# Patient Record
Sex: Male | Born: 1955 | Race: White | Hispanic: No | Marital: Married | Smoking: Never smoker
Health system: Southern US, Community
[De-identification: ages and names within clinical notes are randomized; demographics above are authoritative.]

## PROBLEM LIST (undated history)

## (undated) DIAGNOSIS — Z9289 Personal history of other medical treatment: Secondary | ICD-10-CM

## (undated) DIAGNOSIS — N200 Calculus of kidney: Secondary | ICD-10-CM

## (undated) DIAGNOSIS — J189 Pneumonia, unspecified organism: Secondary | ICD-10-CM

## (undated) DIAGNOSIS — Z9889 Other specified postprocedural states: Secondary | ICD-10-CM

## (undated) DIAGNOSIS — L02419 Cutaneous abscess of limb, unspecified: Secondary | ICD-10-CM

## (undated) DIAGNOSIS — L03119 Cellulitis of unspecified part of limb: Secondary | ICD-10-CM

## (undated) DIAGNOSIS — I4891 Unspecified atrial fibrillation: Secondary | ICD-10-CM

## (undated) HISTORY — PX: CHOLECYSTECTOMY: SHX55

## (undated) HISTORY — PX: HERNIA REPAIR: SHX51

## (undated) HISTORY — PX: APPENDECTOMY: SHX54

---

## 2005-10-27 HISTORY — PX: CARDIAC SURGERY: SHX584

## 2005-10-27 HISTORY — PX: CARDIAC CATHETERIZATION: SHX172

## 2006-10-27 DIAGNOSIS — Z9889 Other specified postprocedural states: Secondary | ICD-10-CM

## 2006-10-27 HISTORY — DX: Other specified postprocedural states: Z98.890

## 2014-07-29 ENCOUNTER — Emergency Department (HOSPITAL_COMMUNITY)
Admission: EM | Admit: 2014-07-29 | Discharge: 2014-07-29 | Disposition: A | Discharge status: Home / Self Care | Payer: PRIVATE HEALTH INSURANCE | Attending: Emergency Medicine | Admitting: Emergency Medicine

## 2014-07-29 ENCOUNTER — Encounter (HOSPITAL_COMMUNITY): Payer: Self-pay | Admitting: Emergency Medicine

## 2014-07-29 ENCOUNTER — Emergency Department (HOSPITAL_COMMUNITY): Payer: PRIVATE HEALTH INSURANCE

## 2014-07-29 DIAGNOSIS — Z7952 Long term (current) use of systemic steroids: Secondary | ICD-10-CM | POA: Diagnosis not present

## 2014-07-29 DIAGNOSIS — S52601A Unspecified fracture of lower end of right ulna, initial encounter for closed fracture: Secondary | ICD-10-CM | POA: Diagnosis not present

## 2014-07-29 DIAGNOSIS — Z79899 Other long term (current) drug therapy: Secondary | ICD-10-CM | POA: Diagnosis not present

## 2014-07-29 DIAGNOSIS — Z87442 Personal history of urinary calculi: Secondary | ICD-10-CM | POA: Diagnosis not present

## 2014-07-29 DIAGNOSIS — S01422A Laceration with foreign body of left cheek and temporomandibular area, initial encounter: Secondary | ICD-10-CM | POA: Insufficient documentation

## 2014-07-29 DIAGNOSIS — Z872 Personal history of diseases of the skin and subcutaneous tissue: Secondary | ICD-10-CM | POA: Diagnosis not present

## 2014-07-29 DIAGNOSIS — Z8701 Personal history of pneumonia (recurrent): Secondary | ICD-10-CM | POA: Diagnosis not present

## 2014-07-29 DIAGNOSIS — S0181XA Laceration without foreign body of other part of head, initial encounter: Secondary | ICD-10-CM

## 2014-07-29 DIAGNOSIS — S0990XA Unspecified injury of head, initial encounter: Secondary | ICD-10-CM | POA: Diagnosis present

## 2014-07-29 DIAGNOSIS — Y9389 Activity, other specified: Secondary | ICD-10-CM | POA: Insufficient documentation

## 2014-07-29 DIAGNOSIS — Y929 Unspecified place or not applicable: Secondary | ICD-10-CM | POA: Diagnosis not present

## 2014-07-29 DIAGNOSIS — Z792 Long term (current) use of antibiotics: Secondary | ICD-10-CM | POA: Insufficient documentation

## 2014-07-29 DIAGNOSIS — Z8739 Personal history of other diseases of the musculoskeletal system and connective tissue: Secondary | ICD-10-CM | POA: Diagnosis not present

## 2014-07-29 DIAGNOSIS — Z9889 Other specified postprocedural states: Secondary | ICD-10-CM | POA: Diagnosis not present

## 2014-07-29 DIAGNOSIS — S52501A Unspecified fracture of the lower end of right radius, initial encounter for closed fracture: Secondary | ICD-10-CM | POA: Diagnosis not present

## 2014-07-29 DIAGNOSIS — Z7982 Long term (current) use of aspirin: Secondary | ICD-10-CM | POA: Insufficient documentation

## 2014-07-29 DIAGNOSIS — W19XXXA Unspecified fall, initial encounter: Secondary | ICD-10-CM

## 2014-07-29 DIAGNOSIS — W01198A Fall on same level from slipping, tripping and stumbling with subsequent striking against other object, initial encounter: Secondary | ICD-10-CM | POA: Diagnosis not present

## 2014-07-29 HISTORY — DX: Cutaneous abscess of limb, unspecified: L02.419

## 2014-07-29 HISTORY — DX: Other specified postprocedural states: Z98.890

## 2014-07-29 HISTORY — DX: Calculus of kidney: N20.0

## 2014-07-29 HISTORY — DX: Cutaneous abscess of limb, unspecified: L03.119

## 2014-07-29 HISTORY — DX: Personal history of other medical treatment: Z92.89

## 2014-07-29 HISTORY — DX: Unspecified atrial fibrillation: I48.91

## 2014-07-29 HISTORY — DX: Pneumonia, unspecified organism: J18.9

## 2014-07-29 LAB — CBC WITH DIFFERENTIAL/PLATELET
BASOS ABS: 0.1 10*3/uL (ref 0.0–0.1)
Basophils Relative: 1 % (ref 0–1)
EOS PCT: 2 % (ref 0–5)
Eosinophils Absolute: 0.2 10*3/uL (ref 0.0–0.7)
HCT: 40.9 % (ref 39.0–52.0)
Hemoglobin: 14.1 g/dL (ref 13.0–17.0)
Lymphocytes Relative: 12 % (ref 12–46)
Lymphs Abs: 1 10*3/uL (ref 0.7–4.0)
MCH: 34.4 pg — ABNORMAL HIGH (ref 26.0–34.0)
MCHC: 34.5 g/dL (ref 30.0–36.0)
MCV: 99.8 fL (ref 78.0–100.0)
Monocytes Absolute: 0.9 10*3/uL (ref 0.1–1.0)
Monocytes Relative: 10 % (ref 3–12)
NEUTROS PCT: 75 % (ref 43–77)
Neutro Abs: 6.1 10*3/uL (ref 1.7–7.7)
PLATELETS: 119 10*3/uL — AB (ref 150–400)
RBC: 4.1 MIL/uL — ABNORMAL LOW (ref 4.22–5.81)
RDW: 14.6 % (ref 11.5–15.5)
WBC: 8.2 10*3/uL (ref 4.0–10.5)

## 2014-07-29 LAB — BASIC METABOLIC PANEL
ANION GAP: 12 (ref 5–15)
BUN: 21 mg/dL (ref 6–23)
CALCIUM: 8.7 mg/dL (ref 8.4–10.5)
CO2: 22 mEq/L (ref 19–32)
Chloride: 102 mEq/L (ref 96–112)
Creatinine, Ser: 1.45 mg/dL — ABNORMAL HIGH (ref 0.50–1.35)
GFR calc non Af Amer: 52 mL/min — ABNORMAL LOW (ref >90)
GFR, EST AFRICAN AMERICAN: 61 mL/min — AB (ref >90)
Glucose, Bld: 114 mg/dL — ABNORMAL HIGH (ref 70–99)
POTASSIUM: 4.2 meq/L (ref 3.7–5.3)
Sodium: 136 mEq/L — ABNORMAL LOW (ref 137–147)

## 2014-07-29 MED ORDER — CEFAZOLIN SODIUM 1-5 GM-% IV SOLN
1.0000 g | Freq: Once | INTRAVENOUS | Status: AC
  Administered 2014-07-29: 1 g via INTRAVENOUS
  Filled 2014-07-29: qty 50

## 2014-07-29 MED ORDER — FENTANYL CITRATE 0.05 MG/ML IJ SOLN
50.0000 ug | Freq: Once | INTRAMUSCULAR | Status: AC
  Administered 2014-07-29: 50 ug via INTRAVENOUS
  Filled 2014-07-29: qty 2

## 2014-07-29 MED ORDER — MORPHINE SULFATE 4 MG/ML IJ SOLN
4.0000 mg | Freq: Once | INTRAMUSCULAR | Status: AC
  Administered 2014-07-29: 4 mg via INTRAVENOUS
  Filled 2014-07-29: qty 1

## 2014-07-29 MED ORDER — OXYCODONE-ACETAMINOPHEN 5-325 MG PO TABS: 1.0000 | ORAL_TABLET | ORAL | Status: DC | PRN

## 2014-07-29 MED ORDER — CEPHALEXIN 500 MG PO CAPS: 500.0000 mg | ORAL_CAPSULE | Freq: Four times a day (QID) | ORAL | Status: AC

## 2014-07-29 NOTE — ED Provider Notes (Signed)
I saw and evaluated the patient, reviewed the resident's note and I agree with the findings and plan.   EKG Interpretation None      Russell Vasquez is a 58 y.o. male hx of afib on pradaxa here with fall. Tripped and fell and hit his face against metal sliding door. Tetanus up to date. On exam, large laceration from corner of R eye to the scalp. Not involving the galea Able to raise R eyebrow. Also has bruise on R forearm and tenderness on the arm. Given ancef. CT head/neck/face unremarkable. Called ENT given large and complex laceration. R forearm xray showed impacted comminuted fracture R distal radius. Ortho, Dr. Janee Mornhompson, called, who recommend sugar tongue splint and outpatient f/u. Will d/c home with keflex, pain meds.    Richardean Canalavid H Yao, MD 07/29/14 [phone]

## 2014-07-29 NOTE — ED Notes (Signed)
Laceration documented on physical diagram

## 2014-07-29 NOTE — Consult Note (Signed)
Reason for Consult: Complex facial and scalp lacerations Referring Physician: Renne Musca, MD  HPI:  Russell Vasquez is an 58 y.o. male who was transported to the [facility] ER for treatment of his large facial and head lacerations. He sustained the injury from an accidental fall, cutting his head on a metal door. He has a history of afib, and is currently on Pradexa. Patient also landed on an outstretched right arm and has deformity to the mid forearm. The patient denies any neck pain, denies chest or abdominal pain. Patient is neurovascularly intact. Patient states that he had a tetanus shot one month ago.  Past Medical History  Diagnosis Date  . A-fib   . Pneumonia   . Cellulitis and abscess of lower extremity     left  . H/O colonoscopy 2008    with polyps  . History of cardioversion 2008, 2007, 2006 x2, 2005, 2004    for afib  . Kidney stones     Past Surgical History  Procedure Laterality Date  . Cholecystectomy    . Cardiac surgery  2007  . Cardiac catheterization  2007  . Hernia repair    . Appendectomy      No family history on file.  Social History:  reports that he has never smoked. He does not have any smokeless tobacco history on file. He reports that he does not drink alcohol or use illicit drugs.  Allergies: No Known Allergies  Prior to Admission medications   Medication Sig Start Date End Date Taking? Authorizing Provider  acetaminophen (TYLENOL) 325 MG tablet Take 650 mg by mouth every 6 (six) hours as needed for moderate pain.   Yes Historical Provider, MD  aspirin EC 81 MG tablet Take 81 mg by mouth 2 (two) times daily.   Yes Historical Provider, MD  carvedilol (COREG) 25 MG tablet Take 50 mg by mouth 2 (two) times daily with a meal.   Yes Historical Provider, MD  citalopram (CELEXA) 20 MG tablet Take 20 mg by mouth daily.   Yes Historical Provider, MD  dabigatran (PRADAXA) 150 MG CAPS capsule Take 150 mg by mouth 2 (two) times daily.   Yes Historical Provider, MD    lisinopril (PRINIVIL,ZESTRIL) 40 MG tablet Take 40 mg by mouth daily.   Yes Historical Provider, MD  Miconazole Nitrate (ZEASORB-AF EX) Apply 1 application topically daily.   Yes Historical Provider, MD  Omega-3 Fatty Acids (FISH OIL) 1000 MG CAPS Take 1,000 mg by mouth 2 (two) times daily.   Yes Historical Provider, MD  rosuvastatin (CRESTOR) 20 MG tablet Take 20 mg by mouth at bedtime.   Yes Historical Provider, MD  testosterone (ANDROGEL) 50 MG/5GM (1%) GEL Place 5 g onto the skin daily.   Yes Historical Provider, MD  triamterene-hydrochlorothiazide (MAXZIDE-25) 37.5-25 MG per tablet Take 1 tablet by mouth daily.   Yes Historical Provider, MD  zolpidem (AMBIEN) 10 MG tablet Take 10 mg by mouth at bedtime.   Yes Historical Provider, MD  cephALEXin (KEFLEX) 500 MG capsule Take 1 capsule (500 mg total) by mouth 4 (four) times daily. 07/29/14 08/08/14  Renne Musca, MD  furosemide (LASIX) 40 MG tablet Take 40 mg by mouth as needed for fluid or edema.    Historical Provider, MD  oxyCODONE-acetaminophen (PERCOCET/ROXICET) 5-325 MG per tablet Take 1 tablet by mouth every 4 (four) hours as needed for severe pain. 07/29/14   Renne Musca, MD    Results for orders placed during the hospital encounter of 07/29/14 (from  the past 48 hour(s))  CBC WITH DIFFERENTIAL     Status: Abnormal   Collection Time    07/29/14  3:51 PM      Result Value Ref Range   WBC 8.2  4.0 - 10.5 K/uL   RBC 4.10 (*) 4.22 - 5.81 MIL/uL   Hemoglobin 14.1  13.0 - 17.0 g/dL   HCT 40.9  39.0 - 52.0 %   MCV 99.8  78.0 - 100.0 fL   MCH 34.4 (*) 26.0 - 34.0 pg   MCHC 34.5  30.0 - 36.0 g/dL   RDW 14.6  11.5 - 15.5 %   Platelets 119 (*) 150 - 400 K/uL   Comment: PLATELET COUNT CONFIRMED BY SMEAR   Neutrophils Relative % 75  43 - 77 %   Neutro Abs 6.1  1.7 - 7.7 K/uL   Lymphocytes Relative 12  12 - 46 %   Lymphs Abs 1.0  0.7 - 4.0 K/uL   Monocytes Relative 10  3 - 12 %   Monocytes Absolute 0.9  0.1 - 1.0 K/uL   Eosinophils  Relative 2  0 - 5 %   Eosinophils Absolute 0.2  0.0 - 0.7 K/uL   Basophils Relative 1  0 - 1 %   Basophils Absolute 0.1  0.0 - 0.1 K/uL  BASIC METABOLIC PANEL     Status: Abnormal   Collection Time    07/29/14  3:51 PM      Result Value Ref Range   Sodium 136 (*) 137 - 147 mEq/L   Potassium 4.2  3.7 - 5.3 mEq/L   Chloride 102  96 - 112 mEq/L   CO2 22  19 - 32 mEq/L   Glucose, Bld 114 (*) 70 - 99 mg/dL   BUN 21  6 - 23 mg/dL   Creatinine, Ser 1.45 (*) 0.50 - 1.35 mg/dL   Calcium 8.7  8.4 - 10.5 mg/dL   GFR calc non Af Amer 52 (*) >90 mL/min   GFR calc Af Amer 61 (*) >90 mL/min   Comment: (NOTE)     The eGFR has been calculated using the CKD EPI equation.     This calculation has not been validated in all clinical situations.     eGFR's persistently <90 mL/min signify possible Chronic Kidney     Disease.   Anion gap 12  5 - 15    Dg Forearm Right  07/29/2014   CLINICAL DATA:  Status post fall.  Wrist injury.  Initial encounter.  EXAM: RIGHT FOREARM - 2 VIEW  COMPARISON:  None.  FINDINGS: There is a comminuted impacted fracture of the distal radius extending to the articular surface. Additionally there is a minimally displaced fracture of the ulnar styloid process. No evidence for associated acute fractures. Soft tissues unremarkable.  IMPRESSION: Impacted comminuted fracture of the distal radius with intra-articular extension.  Ulnar styloid process fracture.   Electronically Signed   By: Lovey Newcomer M.D.   On: 07/29/2014 17:01   Dg Wrist Complete Right  07/29/2014   CLINICAL DATA:  Fall while walking today as tripped over curb and injured right wrist and distal forearm with laceration.  EXAM: RIGHT WRIST - COMPLETE 3+ VIEW  COMPARISON:  None.  FINDINGS: The examination demonstrates a minimally displaced transverse fracture of the distal radial metaphysis with minimal comminution dorsally. There is a displaced ulnar styloid fracture.  IMPRESSION: Displaced slightly comminuted transverse  fracture of the distal radial metaphysis. Displaced ulnar styloid fracture.   Electronically  Signed   By: Marin Olp M.D.   On: 07/29/2014 16:55   Ct Head Wo Contrast  07/29/2014   CLINICAL DATA:  Patient tripped over a curb and fell hitting his head on a door with right arm deformity and laceration over right temporal area  EXAM: CT HEAD WITHOUT CONTRAST  CT MAXILLOFACIAL WITHOUT CONTRAST  CT CERVICAL SPINE WITHOUT CONTRAST  TECHNIQUE: Multidetector CT imaging of the head, cervical spine, and maxillofacial structures were performed using the standard protocol without intravenous contrast. Multiplanar CT image reconstructions of the cervical spine and maxillofacial structures were also generated.  COMPARISON:  None.  FINDINGS: CT HEAD FINDINGS  Heavy calcification of the anterior falx seen incidentally. No intracranial hemorrhage or extra-axial fluid. No evidence of infarct, mass, or hydrocephalus. Extensive right scalp hematoma beginning in the right temporal region and extending to the right vertex. No skull fracture is identified.  CT MAXILLOFACIAL FINDINGS  There are no facial bone fractures. No significant sinus opacification. No air-fluid levels in the sinuses. In the right temporal region there is a large soft tissue laceration with foci of subcutaneous gas. New  CT CERVICAL SPINE FINDINGS  No soft tissue abnormalities. Carotid calcification noted bilaterally. Lung apices clear bilaterally.  No evidence of cervical spine fracture. No paraspinous hematoma. Normal anterior-posterior alignment. Multilevel degenerative disc disease. Degenerative disc disease is of moderate severity at C5-6 through C7-T1.  IMPRESSION: Soft tissue scalp laceration on the right. No acute intracranial abnormality.  No evidence of facial bone fracture.  Degenerative changes with no acute findings in the cervical spine.   Electronically Signed   By: Skipper Cliche M.D.   On: 07/29/2014 16:16   Ct Cervical Spine Wo  Contrast  07/29/2014   CLINICAL DATA:  Patient tripped over a curb and fell hitting his head on a door with right arm deformity and laceration over right temporal area  EXAM: CT HEAD WITHOUT CONTRAST  CT MAXILLOFACIAL WITHOUT CONTRAST  CT CERVICAL SPINE WITHOUT CONTRAST  TECHNIQUE: Multidetector CT imaging of the head, cervical spine, and maxillofacial structures were performed using the standard protocol without intravenous contrast. Multiplanar CT image reconstructions of the cervical spine and maxillofacial structures were also generated.  COMPARISON:  None.  FINDINGS: CT HEAD FINDINGS  Heavy calcification of the anterior falx seen incidentally. No intracranial hemorrhage or extra-axial fluid. No evidence of infarct, mass, or hydrocephalus. Extensive right scalp hematoma beginning in the right temporal region and extending to the right vertex. No skull fracture is identified.  CT MAXILLOFACIAL FINDINGS  There are no facial bone fractures. No significant sinus opacification. No air-fluid levels in the sinuses. In the right temporal region there is a large soft tissue laceration with foci of subcutaneous gas. New  CT CERVICAL SPINE FINDINGS  No soft tissue abnormalities. Carotid calcification noted bilaterally. Lung apices clear bilaterally.  No evidence of cervical spine fracture. No paraspinous hematoma. Normal anterior-posterior alignment. Multilevel degenerative disc disease. Degenerative disc disease is of moderate severity at C5-6 through C7-T1.  IMPRESSION: Soft tissue scalp laceration on the right. No acute intracranial abnormality.  No evidence of facial bone fracture.  Degenerative changes with no acute findings in the cervical spine.   Electronically Signed   By: Skipper Cliche M.D.   On: 07/29/2014 16:16   Ct Maxillofacial Wo Cm  07/29/2014   CLINICAL DATA:  Patient tripped over a curb and fell hitting his head on a door with right arm deformity and laceration over right temporal area  EXAM: CT  HEAD WITHOUT CONTRAST  CT MAXILLOFACIAL WITHOUT CONTRAST  CT CERVICAL SPINE WITHOUT CONTRAST  TECHNIQUE: Multidetector CT imaging of the head, cervical spine, and maxillofacial structures were performed using the standard protocol without intravenous contrast. Multiplanar CT image reconstructions of the cervical spine and maxillofacial structures were also generated.  COMPARISON:  None.  FINDINGS: CT HEAD FINDINGS  Heavy calcification of the anterior falx seen incidentally. No intracranial hemorrhage or extra-axial fluid. No evidence of infarct, mass, or hydrocephalus. Extensive right scalp hematoma beginning in the right temporal region and extending to the right vertex. No skull fracture is identified.  CT MAXILLOFACIAL FINDINGS  There are no facial bone fractures. No significant sinus opacification. No air-fluid levels in the sinuses. In the right temporal region there is a large soft tissue laceration with foci of subcutaneous gas. New  CT CERVICAL SPINE FINDINGS  No soft tissue abnormalities. Carotid calcification noted bilaterally. Lung apices clear bilaterally.  No evidence of cervical spine fracture. No paraspinous hematoma. Normal anterior-posterior alignment. Multilevel degenerative disc disease. Degenerative disc disease is of moderate severity at C5-6 through C7-T1.  IMPRESSION: Soft tissue scalp laceration on the right. No acute intracranial abnormality.  No evidence of facial bone fracture.  Degenerative changes with no acute findings in the cervical spine.   Electronically Signed   By: Skipper Cliche M.D.   On: 07/29/2014 16:16   Review of Systems  Constitutional: Negative for fever and chills.  HENT: Negative for congestion and sore throat.  Eyes: Negative for visual disturbance.  Respiratory: Negative for shortness of breath and wheezing.  Cardiovascular: Negative for chest pain.  Gastrointestinal: Negative for nausea, vomiting, abdominal pain, diarrhea and constipation.  Genitourinary:  Negative for dysuria and difficulty urinating.  Musculoskeletal: Negative for arthralgias, myalgias and neck pain.  Skin: Positive for wound.  Neurological: Positive for headaches. Negative for syncope.  Psychiatric/Behavioral: Negative for behavioral problems.  All other systems reviewed and are negative.  Blood pressure 103/67, pulse 49, temperature 97.4 F (36.3 C), temperature source Oral, resp. rate 14, height _0  (1.803 m), weight 280 lb (127.007 kg), SpO2 100.00%.  Physical Exam  Vitals reviewed.  Constitutional: He is oriented to person, place, and time. He appears well-developed and well-nourished. Cervical collar and backboard in place.  HENT:  Head: Normocephalic. Head with large forehead and scalp laceration.    Eyes: EOM are normal. Pupils are equal, round, and reactive to light.  Ears: Normal EACs and auricles. Neck: Normal range of motion.  Cardiovascular: Normal rate and normal heart sounds.  No murmur heard.  Pulmonary/Chest: Effort normal and breath sounds normal. No respiratory distress. He has no wheezes. He has no rales. He exhibits no tenderness.  Neurological: He is alert and oriented to person, place, and time.  Skin: No rash noted. He is not diaphoretic.   Procedure: Complex repair of large forehead and scalp lacerations (15cm forehead and 20cm scalp lacerations) Anesthesia: Local anesthesia with 1% lidocaine with 1:100,000 epinephrine Description: The patient is placed supine on the exam table. The forehead and scalp are prepped and draped in a sterile fashion.  After adequate local anesthesia is achieved, the laceration sites are carefully debrided. Extensive soft tissue undermining is performed to release the skin tension. The laceration is closed in layers with interrupted sutures.  The scalp is stapled. The patient tolerated the procedure well.    Assessment/Plan: Large and complex forehead and scalp lacerations. Repaired in ER under local anesthesia.  Pt will follow up in my office in 1  week. Pt will be d/c'ed home on abx and pain med.  Riaz Onorato,SUI W 07/29/2014, 8:44 PM

## 2014-07-29 NOTE — ED Provider Notes (Signed)
CSN: [id number]     Arrival date & time 07/29/14  1419 History   None    Chief Complaint  Patient presents with  . Fall  . Head Laceration  . Wrist Injury     (Consider location/radiation/quality/duration/timing/severity/associated sxs/prior Treatment) Patient is a 58 y.o. male presenting with fall.  Fall This is a new problem. The current episode started today. The problem occurs constantly. The problem has been unchanged. Associated symptoms include headaches. Pertinent negatives include no abdominal pain, arthralgias, chest pain, chills, congestion, fever, myalgias, nausea, neck pain, sore throat or vomiting. Associated symptoms comments: Face laceration. Nothing aggravates the symptoms. He has tried nothing for the symptoms.    Past Medical History  Diagnosis Date  . A-fib   . Pneumonia   . Cellulitis and abscess of lower extremity     left  . H/O colonoscopy 2008    with polyps  . History of cardioversion 2008, 2007, 2006 x2, 2005, 2004    for afib  . Kidney stones    Past Surgical History  Procedure Laterality Date  . Cholecystectomy    . Cardiac surgery  2007  . Cardiac catheterization  2007  . Hernia repair    . Appendectomy     No family history on file. History  Substance Use Topics  . Smoking status: Never Smoker   . Smokeless tobacco: Not on file  . Alcohol Use: No    Review of Systems  Constitutional: Negative for fever and chills.  HENT: Negative for congestion and sore throat.   Eyes: Negative for visual disturbance.  Respiratory: Negative for shortness of breath and wheezing.   Cardiovascular: Negative for chest pain.  Gastrointestinal: Negative for nausea, vomiting, abdominal pain, diarrhea and constipation.  Genitourinary: Negative for dysuria and difficulty urinating.  Musculoskeletal: Negative for arthralgias, myalgias and neck pain.  Skin: Positive for wound.  Neurological: Positive for headaches. Negative for syncope.    Psychiatric/Behavioral: Negative for behavioral problems.  All other systems reviewed and are negative.     Allergies  Review of patient's allergies indicates no known allergies.  Home Medications   Prior to Admission medications   Medication Sig Start Date End Date Taking? Authorizing Provider  acetaminophen (TYLENOL) 325 MG tablet Take 650 mg by mouth every 6 (six) hours as needed for moderate pain.   Yes Historical Provider, MD  aspirin EC 81 MG tablet Take 81 mg by mouth 2 (two) times daily.   Yes Historical Provider, MD  carvedilol (COREG) 25 MG tablet Take 50 mg by mouth 2 (two) times daily with a meal.   Yes Historical Provider, MD  citalopram (CELEXA) 20 MG tablet Take 20 mg by mouth daily.   Yes Historical Provider, MD  dabigatran (PRADAXA) 150 MG CAPS capsule Take 150 mg by mouth 2 (two) times daily.   Yes Historical Provider, MD  lisinopril (PRINIVIL,ZESTRIL) 40 MG tablet Take 40 mg by mouth daily.   Yes Historical Provider, MD  Miconazole Nitrate (ZEASORB-AF EX) Apply 1 application topically daily.   Yes Historical Provider, MD  Omega-3 Fatty Acids (FISH OIL) 1000 MG CAPS Take 1,000 mg by mouth 2 (two) times daily.   Yes Historical Provider, MD  rosuvastatin (CRESTOR) 20 MG tablet Take 20 mg by mouth at bedtime.   Yes Historical Provider, MD  testosterone (ANDROGEL) 50 MG/5GM (1%) GEL Place 5 g onto the skin daily.   Yes Historical Provider, MD  triamterene-hydrochlorothiazide (MAXZIDE-25) 37.5-25 MG per tablet Take 1 tablet by mouth daily.  Yes Historical Provider, MD  zolpidem (AMBIEN) 10 MG tablet Take 10 mg by mouth at bedtime.   Yes Historical Provider, MD  cephALEXin (KEFLEX) 500 MG capsule Take 1 capsule (500 mg total) by mouth 4 (four) times daily. 07/29/14 08/08/14  Beverely Risen, MD  furosemide (LASIX) 40 MG tablet Take 40 mg by mouth as needed for fluid or edema.    Historical Provider, MD  oxyCODONE-acetaminophen (PERCOCET/ROXICET) 5-325 MG per tablet Take 1 tablet  by mouth every 4 (four) hours as needed for severe pain. 07/29/14   Beverely Risen, MD   BP 133/94  Pulse 80  Temp(Src) 97.4 F (36.3 C) (Oral)  Resp 16  Ht 5\' 11"  (1.803 m)  Wt 280 lb (127.007 kg)  BMI 39.07 kg/m2  SpO2 99% Physical Exam  Vitals reviewed. Constitutional: He is oriented to person, place, and time. He appears well-developed and well-nourished. Cervical collar and backboard in place.  HENT:  Head: Normocephalic. Head is with laceration.    Eyes: EOM are normal. Pupils are equal, round, and reactive to light.  Neck: Normal range of motion.  Cardiovascular: Normal rate and normal heart sounds.   No murmur heard. Pulmonary/Chest: Effort normal and breath sounds normal. No respiratory distress. He has no wheezes. He has no rales. He exhibits no tenderness.  Abdominal: Soft. Bowel sounds are normal. He exhibits no distension. There is no tenderness.  Musculoskeletal: He exhibits no edema.  Neurological: He is alert and oriented to person, place, and time.  Skin: No rash noted. He is not diaphoretic.      ED Course  Procedures (including critical care time) Labs Review Labs Reviewed  CBC WITH DIFFERENTIAL - Abnormal; Notable for the following:    RBC 4.10 (*)    MCH 34.4 (*)    Platelets 119 (*)    All other components within normal limits  BASIC METABOLIC PANEL - Abnormal; Notable for the following:    Sodium 136 (*)    Glucose, Bld 114 (*)    Creatinine, Ser 1.45 (*)    GFR calc non Af Amer 52 (*)    GFR calc Af Amer 61 (*)    All other components within normal limits    Imaging Review Dg Forearm Right  07/29/2014   CLINICAL DATA:  Status post fall.  Wrist injury.  Initial encounter.  EXAM: RIGHT FOREARM - 2 VIEW  COMPARISON:  None.  FINDINGS: There is a comminuted impacted fracture of the distal radius extending to the articular surface. Additionally there is a minimally displaced fracture of the ulnar styloid process. No evidence for associated acute  fractures. Soft tissues unremarkable.  IMPRESSION: Impacted comminuted fracture of the distal radius with intra-articular extension.  Ulnar styloid process fracture.   Electronically Signed   By: Annia Belt M.D.   On: 07/29/2014 17:01   Dg Wrist Complete Right  07/29/2014   CLINICAL DATA:  Fall while walking today as tripped over curb and injured right wrist and distal forearm with laceration.  EXAM: RIGHT WRIST - COMPLETE 3+ VIEW  COMPARISON:  None.  FINDINGS: The examination demonstrates a minimally displaced transverse fracture of the distal radial metaphysis with minimal comminution dorsally. There is a displaced ulnar styloid fracture.  IMPRESSION: Displaced slightly comminuted transverse fracture of the distal radial metaphysis. Displaced ulnar styloid fracture.   Electronically Signed   By: Elberta Fortis M.D.   On: 07/29/2014 16:55   Ct Head Wo Contrast  07/29/2014   CLINICAL DATA:  Patient tripped  over a curb and fell hitting his head on a door with right arm deformity and laceration over right temporal area  EXAM: CT HEAD WITHOUT CONTRAST  CT MAXILLOFACIAL WITHOUT CONTRAST  CT CERVICAL SPINE WITHOUT CONTRAST  TECHNIQUE: Multidetector CT imaging of the head, cervical spine, and maxillofacial structures were performed using the standard protocol without intravenous contrast. Multiplanar CT image reconstructions of the cervical spine and maxillofacial structures were also generated.  COMPARISON:  None.  FINDINGS: CT HEAD FINDINGS  Heavy calcification of the anterior falx seen incidentally. No intracranial hemorrhage or extra-axial fluid. No evidence of infarct, mass, or hydrocephalus. Extensive right scalp hematoma beginning in the right temporal region and extending to the right vertex. No skull fracture is identified.  CT MAXILLOFACIAL FINDINGS  There are no facial bone fractures. No significant sinus opacification. No air-fluid levels in the sinuses. In the right temporal region there is a large soft  tissue laceration with foci of subcutaneous gas. New  CT CERVICAL SPINE FINDINGS  No soft tissue abnormalities. Carotid calcification noted bilaterally. Lung apices clear bilaterally.  No evidence of cervical spine fracture. No paraspinous hematoma. Normal anterior-posterior alignment. Multilevel degenerative disc disease. Degenerative disc disease is of moderate severity at C5-6 through C7-T1.  IMPRESSION: Soft tissue scalp laceration on the right. No acute intracranial abnormality.  No evidence of facial bone fracture.  Degenerative changes with no acute findings in the cervical spine.   Electronically Signed   By: Esperanza Heir M.D.   On: 07/29/2014 16:16   Ct Cervical Spine Wo Contrast  07/29/2014   CLINICAL DATA:  Patient tripped over a curb and fell hitting his head on a door with right arm deformity and laceration over right temporal area  EXAM: CT HEAD WITHOUT CONTRAST  CT MAXILLOFACIAL WITHOUT CONTRAST  CT CERVICAL SPINE WITHOUT CONTRAST  TECHNIQUE: Multidetector CT imaging of the head, cervical spine, and maxillofacial structures were performed using the standard protocol without intravenous contrast. Multiplanar CT image reconstructions of the cervical spine and maxillofacial structures were also generated.  COMPARISON:  None.  FINDINGS: CT HEAD FINDINGS  Heavy calcification of the anterior falx seen incidentally. No intracranial hemorrhage or extra-axial fluid. No evidence of infarct, mass, or hydrocephalus. Extensive right scalp hematoma beginning in the right temporal region and extending to the right vertex. No skull fracture is identified.  CT MAXILLOFACIAL FINDINGS  There are no facial bone fractures. No significant sinus opacification. No air-fluid levels in the sinuses. In the right temporal region there is a large soft tissue laceration with foci of subcutaneous gas. New  CT CERVICAL SPINE FINDINGS  No soft tissue abnormalities. Carotid calcification noted bilaterally. Lung apices clear  bilaterally.  No evidence of cervical spine fracture. No paraspinous hematoma. Normal anterior-posterior alignment. Multilevel degenerative disc disease. Degenerative disc disease is of moderate severity at C5-6 through C7-T1.  IMPRESSION: Soft tissue scalp laceration on the right. No acute intracranial abnormality.  No evidence of facial bone fracture.  Degenerative changes with no acute findings in the cervical spine.   Electronically Signed   By: Esperanza Heir M.D.   On: 07/29/2014 16:16   Ct Maxillofacial Wo Cm  07/29/2014   CLINICAL DATA:  Patient tripped over a curb and fell hitting his head on a door with right arm deformity and laceration over right temporal area  EXAM: CT HEAD WITHOUT CONTRAST  CT MAXILLOFACIAL WITHOUT CONTRAST  CT CERVICAL SPINE WITHOUT CONTRAST  TECHNIQUE: Multidetector CT imaging of the head, cervical spine, and maxillofacial structures  were performed using the standard protocol without intravenous contrast. Multiplanar CT image reconstructions of the cervical spine and maxillofacial structures were also generated.  COMPARISON:  None.  FINDINGS: CT HEAD FINDINGS  Heavy calcification of the anterior falx seen incidentally. No intracranial hemorrhage or extra-axial fluid. No evidence of infarct, mass, or hydrocephalus. Extensive right scalp hematoma beginning in the right temporal region and extending to the right vertex. No skull fracture is identified.  CT MAXILLOFACIAL FINDINGS  There are no facial bone fractures. No significant sinus opacification. No air-fluid levels in the sinuses. In the right temporal region there is a large soft tissue laceration with foci of subcutaneous gas. New  CT CERVICAL SPINE FINDINGS  No soft tissue abnormalities. Carotid calcification noted bilaterally. Lung apices clear bilaterally.  No evidence of cervical spine fracture. No paraspinous hematoma. Normal anterior-posterior alignment. Multilevel degenerative disc disease. Degenerative disc disease is  of moderate severity at C5-6 through C7-T1.  IMPRESSION: Soft tissue scalp laceration on the right. No acute intracranial abnormality.  No evidence of facial bone fracture.  Degenerative changes with no acute findings in the cervical spine.   Electronically Signed   By: Esperanza Heiraymond  Rubner M.D.   On: 07/29/2014 16:16     EKG Interpretation None      MDM   Final diagnoses:  Fall, initial encounter  Facial laceration, initial encounter  Fracture of right distal radius, closed, initial encounter  Right distal ulnar fracture, closed, initial encounter    Patient is a 58 year old male with past medical history is negative for A. fib currently on Pradaxa that presents after fall. Patient states that he tripped on a sidewalk and hit his face against a metal sliding door. Patient also landed on an outstretched right arm and has deformity to the mid forearm. Patient was placed on backboard and c-collar was applied EMS arrived and dressing was placed. Due to the long transport time patient states that he was on the backboard for almost 3 hours. On route to the ED the patient's wounds are currently hemostatic, AF VSS, patient denies neck pain, denies chest abdominal pain. Patient's laceration as seen above is approximately 12 inches in length and and goes to the level of the aponeurosis. Patient's right forearm has deformity to the radial aspect in the mid forearm. Patient is neurovascularly intact. We will image the patient's head neck and right upper extremity. Patient given IV pain medication. Patient states that he had a tetanus shot one month ago. ENT repaired patient's facial laceration and he is to follow up in one week with them with Keflex. Orthopedics evaluated images and will see the patient in their clinic in one to 2 weeks and will call patient with appointment. Patient provided with prescription for pain medication, Keflex. Patient wife provided with wound care and fracture care information which  they voiced understanding to. Patient discharged in stable condition   Beverely Risenennis Rambo Sarafian, MD 07/29/14 [phone]

## 2014-07-29 NOTE — Discharge Instructions (Signed)
Wrist Fracture °A wrist fracture is a break or crack in one of the bones of your wrist. Your wrist is made up of eight small bones at the palm of your hand (carpal bones) and two long bones that make up your forearm (radius and ulna).  °CAUSES  °· A direct blow to the wrist. °· Falling on an outstretched hand. °· Trauma, such as a car accident or a fall. °RISK FACTORS °Risk factors for wrist fracture include:  °· Participating in contact and high-risk sports, such as skiing, biking, and ice skating. °· Taking steroid medicines. °· Smoking. °· Being male. °· Being Caucasian. °· Drinking more than three alcoholic beverages per day. °· Having low or lowered bone density (osteoporosis or osteopenia). °· Age. Older adults have decreased bone density. °· Women who have had menopause. °· History of previous fractures. °SIGNS AND SYMPTOMS °Symptoms of wrist fractures include tenderness, bruising, and inflammation. Additionally, the wrist may hang in an odd position or appear deformed.  °DIAGNOSIS °Diagnosis may include: °· Physical exam. °· X-ray. °TREATMENT °Treatment depends on many factors, including the nature and location of the fracture, your age, and your activity level. Treatment for wrist fracture can be nonsurgical or surgical.  °Nonsurgical Treatment °A plaster cast or splint may be applied to your wrist if the bone is in a good position. If the fracture is not in good position, it may be necessary for your health care provider to realign it before applying a splint or cast. Usually, a cast or splint will be worn for several weeks.  °Surgical Treatment °Sometimes the position of the bone is so far out of place that surgery is required to apply a device to hold it together as it heals. Depending on the fracture, there are a number of options for holding the bone in place while it heals, such as a cast and metal pins.   °HOME CARE INSTRUCTIONS °· Keep your injured wrist elevated and move your fingers as much as  possible. °· Do not put pressure on any part of your cast or splint. It may break.   °· Use a plastic bag to protect your cast or splint from water while bathing or showering. Do not lower your cast or splint into water. °· Take medicines only as directed by your health care provider. °· Keep your cast or splint clean and dry. If it becomes wet, damaged, or suddenly feels too tight, contact your health care provider right away. °· Do not use any tobacco products including cigarettes, chewing tobacco, or electronic cigarettes. Tobacco can delay bone healing. If you need help quitting, ask your health care provider. °· Keep all follow-up visits as directed by your health care provider. This is important. °· Ask your health care provider if you should take supplements of calcium and vitamins C and D to promote bone healing. °SEEK MEDICAL CARE IF:  °· Your cast or splint is damaged, breaks, or gets wet. °· You have a fever. °· You have chills. °· You have continued severe pain or more swelling than you did before the cast was put on. °SEEK IMMEDIATE MEDICAL CARE IF:  °· Your hand or fingernails on the injured arm turn blue or gray, or feel cold or numb. °· You have decreased feeling in the fingers of your injured arm. °MAKE SURE YOU: °· Understand these instructions. °· Will watch your condition. °· Will get help right away if you are not doing well or get worse. °Document Released: 07/23/2005 Document Revised:   02/27/2014 Document Reviewed: 10/31/2011 [employer] Patient Information 2015 [place]. This information is not intended to replace advice given to you by your health care provider. Make sure you discuss any questions you have with your health care provider. Laceration Care, Adult A laceration is a cut or lesion that goes through all layers of the skin and into the tissue just beneath the skin. TREATMENT  Some lacerations may not require closure. Some lacerations may not be able to be closed due to an  increased risk of infection. It is important to see your caregiver as soon as possible after an injury to minimize the risk of infection and maximize the opportunity for successful closure. If closure is appropriate, pain medicines may be given, if needed. The wound will be cleaned to help prevent infection. Your caregiver will use stitches (sutures), staples, wound glue (adhesive), or skin adhesive strips to repair the laceration. These tools bring the skin edges together to allow for faster healing and a better cosmetic outcome. However, all wounds will heal with a scar. Once the wound has healed, scarring can be minimized by covering the wound with sunscreen during the day for 1 full year. HOME CARE INSTRUCTIONS  For sutures or staples:  Keep the wound clean and dry.  If you were given a bandage (dressing), you should change it at least once a day. Also, change the dressing if it becomes wet or dirty, or as directed by your caregiver.  Wash the wound with soap and water 2 times a day. Rinse the wound off with water to remove all soap. Pat the wound dry with a clean towel.  After cleaning, apply a thin layer of the antibiotic ointment as recommended by your caregiver. This will help prevent infection and keep the dressing from sticking.  You may shower as usual after the first 24 hours. Do not soak the wound in water until the sutures are removed.  Only take over-the-counter or prescription medicines for pain, discomfort, or fever as directed by your caregiver.  Get your sutures or staples removed as directed by your caregiver. For skin adhesive strips:  Keep the wound clean and dry.  Do not get the skin adhesive strips wet. You may bathe carefully, using caution to keep the wound dry.  If the wound gets wet, pat it dry with a clean towel.  Skin adhesive strips will fall off on their own. You may trim the strips as the wound heals. Do not remove skin adhesive strips that are still stuck  to the wound. They will fall off in time. For wound adhesive:  You may briefly wet your wound in the shower or bath. Do not soak or scrub the wound. Do not swim. Avoid periods of heavy perspiration until the skin adhesive has fallen off on its own. After showering or bathing, gently pat the wound dry with a clean towel.  Do not apply liquid medicine, cream medicine, or ointment medicine to your wound while the skin adhesive is in place. This may loosen the film before your wound is healed.  If a dressing is placed over the wound, be careful not to apply tape directly over the skin adhesive. This may cause the adhesive to be pulled off before the wound is healed.  Avoid prolonged exposure to sunlight or tanning lamps while the skin adhesive is in place. Exposure to ultraviolet light in the first year will darken the scar.  The skin adhesive will usually remain in place for 5 to  10 days, then naturally fall off the skin. Do not pick at the adhesive film. You may need a tetanus shot if:  You cannot remember when you had your last tetanus shot.  You have never had a tetanus shot. If you get a tetanus shot, your arm may swell, get red, and feel warm to the touch. This is common and not a problem. If you need a tetanus shot and you choose not to have one, there is a rare chance of getting tetanus. Sickness from tetanus can be serious. SEEK MEDICAL CARE IF:   You have redness, swelling, or increasing pain in the wound.  You see a red line that goes away from the wound.  You have yellowish-white fluid (pus) coming from the wound.  You have a fever.  You notice a bad smell coming from the wound or dressing.  Your wound breaks open before or after sutures have been removed.  You notice something coming out of the wound such as wood or glass.  Your wound is on your hand or foot and you cannot move a finger or toe. SEEK IMMEDIATE MEDICAL CARE IF:   Your pain is not controlled with prescribed  medicine.  You have severe swelling around the wound causing pain and numbness or a change in color in your arm, hand, leg, or foot.  Your wound splits open and starts bleeding.  You have worsening numbness, weakness, or loss of function of any joint around or beyond the wound.  You develop painful lumps near the wound or on the skin anywhere on your body. MAKE SURE YOU:   Understand these instructions.  Will watch your condition.  Will get help right away if you are not doing well or get worse. Document Released: 10/13/2005 Document Revised: 01/05/2012 Document Reviewed: 04/08/2011 [facility] Patient Information 2015 [place], [place]. This information is not intended to replace advice given to you by your health care provider. Make sure you discuss any questions you have with your health care provider.

## 2014-07-29 NOTE — Progress Notes (Signed)
Orthopedic Tech Progress Note Patient Details:  Jacqulyn BathGeorge Hick [birth year] [id number]  Ortho Devices Type of Ortho Device: Sugartong splint;Ace wrap;Arm sling Ortho Device/Splint Interventions: Application   Cammer, Mickie BailJennifer Carol 07/29/2014, 5:36 PM

## 2014-07-29 NOTE — ED Notes (Signed)
Plastic surgery at bedside

## 2014-07-29 NOTE — ED Notes (Signed)
[facility] pt tripped over a curb, fell hitting his head on the doorsill to an automatic door. Pt has deformity to right forearm, and laceration to the top of his head, J shaped per EMS, 18g to LAC. Pt with hx of afib and same on monitor.

## 2021-01-22 ENCOUNTER — Encounter: Payer: Self-pay | Admitting: Emergency Medicine

## 2021-01-22 ENCOUNTER — Emergency Department (INDEPENDENT_AMBULATORY_CARE_PROVIDER_SITE_OTHER): Payer: Self-pay

## 2021-01-22 ENCOUNTER — Emergency Department (INDEPENDENT_AMBULATORY_CARE_PROVIDER_SITE_OTHER)
Admission: EM | Admit: 2021-01-22 | Discharge: 2021-01-22 | Disposition: A | Discharge status: Home / Self Care | Payer: Self-pay | Source: Home / Self Care | Attending: Family Medicine | Admitting: Family Medicine

## 2021-01-22 DIAGNOSIS — M545 Low back pain, unspecified: Secondary | ICD-10-CM

## 2021-01-22 DIAGNOSIS — S161XXA Strain of muscle, fascia and tendon at neck level, initial encounter: Secondary | ICD-10-CM

## 2021-01-22 DIAGNOSIS — M5441 Lumbago with sciatica, right side: Secondary | ICD-10-CM

## 2021-01-22 MED ORDER — ACETAMINOPHEN 325 MG PO TABS
650.0000 mg | ORAL_TABLET | Freq: Once | ORAL | Status: AC
  Administered 2021-01-22: 650 mg via ORAL

## 2021-01-22 MED ORDER — PREDNISONE 20 MG PO TABS: ORAL_TABLET | ORAL | 0 refills | Status: DC
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
## 2021-01-22 NOTE — Discharge Instructions (Addendum)
Apply ice pack to painful areas for 20 to 30 minutes, 3 to 4 times daily  Continue until pain and swelling decrease.  May take Tylenol 2 to 3 times daily as needed for pain. Begin neck and back range of motion and stretching exercises in about 5 days as tolerated.
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
## 2024-01-15 ENCOUNTER — Other Ambulatory Visit: Payer: Self-pay

## 2024-01-15 ENCOUNTER — Ambulatory Visit

## 2024-01-15 ENCOUNTER — Ambulatory Visit
Admission: EM | Admit: 2024-01-15 | Discharge: 2024-01-15 | Disposition: A | Discharge status: Home / Self Care | Attending: Family Medicine | Admitting: Family Medicine

## 2024-01-15 DIAGNOSIS — M549 Dorsalgia, unspecified: Secondary | ICD-10-CM | POA: Diagnosis not present

## 2024-01-15 DIAGNOSIS — S299XXA Unspecified injury of thorax, initial encounter: Secondary | ICD-10-CM

## 2024-01-15 DIAGNOSIS — M4304 Spondylolysis, thoracic region: Secondary | ICD-10-CM | POA: Diagnosis not present

## 2024-01-15 DIAGNOSIS — S3992XA Unspecified injury of lower back, initial encounter: Secondary | ICD-10-CM

## 2024-01-15 DIAGNOSIS — S20212A Contusion of left front wall of thorax, initial encounter: Secondary | ICD-10-CM

## 2024-01-15 MED ORDER — HYDROCODONE-ACETAMINOPHEN 5-325 MG PO TABS: 1.0000 | ORAL_TABLET | Freq: Four times a day (QID) | ORAL | 0 refills | Status: AC | PRN

## 2024-01-15 NOTE — Discharge Instructions (Addendum)
 Advised patient of x-ray results with hardcopy provided.  Advised patient may take Norco daily or as needed for left sided back/rib pain.  Patient advised of sedative effects of this medication.  Advised patient if symptoms worsen and/or unresolved please follow-up with your PCP or here for further evaluation.
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
## 2024-01-27 ENCOUNTER — Other Ambulatory Visit: Payer: Self-pay

## 2024-01-27 ENCOUNTER — Ambulatory Visit
Admission: EM | Admit: 2024-01-27 | Discharge: 2024-01-27 | Disposition: A | Discharge status: Home / Self Care | Attending: Family Medicine | Admitting: Family Medicine

## 2024-01-27 DIAGNOSIS — R0781 Pleurodynia: Secondary | ICD-10-CM | POA: Diagnosis not present

## 2024-01-27 DIAGNOSIS — S20211S Contusion of right front wall of thorax, sequela: Secondary | ICD-10-CM

## 2024-01-27 LAB — POCT URINALYSIS DIP (MANUAL ENTRY)
Bilirubin, UA: NEGATIVE
Blood, UA: NEGATIVE
Glucose, UA: >=1000 mg/dL — AB
Ketones, POC UA: NEGATIVE mg/dL
Leukocytes, UA: NEGATIVE
Nitrite, UA: NEGATIVE
Protein Ur, POC: NEGATIVE mg/dL
Spec Grav, UA: 1.015 (ref 1.010–1.025)
Urobilinogen, UA: 0.2 U/dL
pH, UA: 5.5 (ref 5.0–8.0)

## 2024-01-27 MED ORDER — METHOCARBAMOL 750 MG PO TABS: 750.0000 mg | ORAL_TABLET | Freq: Four times a day (QID) | ORAL | 0 refills | Status: AC | PRN

## 2024-01-27 MED ORDER — GABAPENTIN 300 MG PO CAPS: 300.0000 mg | ORAL_CAPSULE | Freq: Every day | ORAL | 0 refills | Status: AC

## 2024-01-27 NOTE — ED Provider Notes (Signed)
 [facility]    CSN: [id number] Arrival date & time: 01/27/24  0816      History   Chief Complaint No chief complaint on file.   HPI Russell Vasquez is a 68 y.o. male.   Russell Vasquez is here for follow-up.  He was seen few days ago for rib injury.  He fell backwards and landed on a brick in the morning.  He is to area on the right flank.  No hematuria.  He was unable to tolerate the hydrocodone secondary to constipation.  He has been taking some Tylenol.  He thought he was getting better but then had a quick movement and increased pain again.  Pain with deep breath.  Pain with movement.  Did not sleep last night. He cannot take anti-inflammatory medication because of his anticoagulated status.  He cannot take the [facility] codone or stronger pain medication because of chronic constipation and exacerbation of this.  We discussed that the biggest complication for revision traces pneumonia.  He denies any cough or congestion or fever    Past Medical History:  Diagnosis Date   A-fib (HCC)    Cellulitis and abscess of lower extremity    left   H/O colonoscopy 2008   with polyps   History of cardioversion 2008, 2007, 2006 x2, 2005, 2004   for afib   Kidney stones    Pneumonia     There are no active problems to display for this patient.   Past Surgical History:  Procedure Laterality Date   APPENDECTOMY     CARDIAC CATHETERIZATION  2007   CARDIAC SURGERY  2007   CHOLECYSTECTOMY     HERNIA REPAIR         Home Medications    Prior to Admission medications   Medication Sig Start Date End Date Taking? Authorizing Provider  gabapentin (NEURONTIN) 300 MG capsule Take 1 capsule (300 mg total) by mouth at bedtime. 01/27/24  Yes Eustace Moore, MD  methocarbamol (ROBAXIN-750) 750 MG tablet Take 1 tablet (750 mg total) by mouth every 6 (six) hours as needed for muscle spasms. 01/27/24  Yes Eustace Moore, MD  acetaminophen (TYLENOL) 325 MG tablet Take 650 mg by mouth every  6 (six) hours as needed for moderate pain.    [provider]  ascorbic acid (VITAMIN C) 1000 MG tablet Take by mouth.    [provider]  carvedilol (COREG) 25 MG tablet Take 50 mg by mouth 2 (two) times daily with a meal.    [provider]  citalopram (CELEXA) 20 MG tablet Take 20 mg by mouth daily.    [provider]  dabigatran (PRADAXA) 150 MG CAPS capsule Take 150 mg by mouth 2 (two) times daily.    [provider]  fluticasone (FLONASE) 50 MCG/ACT nasal spray SPRAY 2 SPRAYS INTO EACH NOSTRIL ONCE DAILY . 07/03/13   [provider]  furosemide (LASIX) 40 MG tablet Take 40 mg by mouth as needed for fluid or edema.    [provider]  HYDROcodone-acetaminophen (NORCO/VICODIN) 5-325 MG tablet Take 1-2 tablets by mouth every 6 (six) hours as needed for severe pain (pain score 7-10). 01/15/24   Trevor Iha, FNP  lisinopril (PRINIVIL,ZESTRIL) 40 MG tablet Take 40 mg by mouth daily.    [provider]  Miconazole Nitrate (ZEASORB-AF EX) Apply 1 application topically daily.    [provider]  Omega-3 Fatty Acids (FISH OIL) 1000 MG CAPS Take 1,000 mg by mouth 2 (  two) times daily.    [provider]  potassium chloride SA (KLOR-CON) 20 MEQ tablet TAKE ONE TABLET DAILY WHEN TAKING FUROSEMIDE. 08/26/20   [provider]  rosuvastatin (CRESTOR) 20 MG tablet Take 20 mg by mouth at bedtime.    [provider]  testosterone (ANDROGEL) 50 MG/5GM (1%) GEL Place 5 g onto the skin daily.    [provider]  triamterene-hydrochlorothiazide (MAXZIDE-25) 37.5-25 MG per tablet Take 1 tablet by mouth daily.    [provider]  zolpidem (AMBIEN) 10 MG tablet Take 10 mg by mouth at bedtime.    [provider]    Family History Family History  Problem Relation Age of Onset   Colon cancer Mother    Stroke Father    Healthy Sister    Healthy Brother    Healthy Sister    Healthy  Sister    Healthy Sister     Social History Social History   Tobacco Use   Smoking status: Never   Smokeless tobacco: Never  Substance Use Topics   Alcohol use: No   Drug use: No     Allergies   Patient has no known allergies.   Review of Systems Review of Systems See HPI  Physical Exam Triage Vital Signs ED Triage Vitals  Encounter Vitals Group     BP 01/27/24 0820 91/68     Systolic BP Percentile --      Diastolic BP Percentile --      Pulse Rate 01/27/24 0820 65     Resp 01/27/24 0820 20     Temp 01/27/24 0820 97.6 F (36.4 C)     Temp src --      SpO2 01/27/24 0820 97 %     Weight --      Height --      Head Circumference --      Peak Flow --      Pain Score 01/27/24 0824 7     Pain Loc --      Pain Education --      Exclude from Growth Chart --    No data found.  Updated Vital Signs BP 91/68   Pulse 65   Temp 97.6 F (36.4 C)   Resp 20   SpO2 97%      Physical Exam Constitutional:      General: He is in acute distress.     Appearance: He is well-developed. He is obese.     Comments: Guarded movements.  Appears acutely uncomfortable  HENT:     Head: Normocephalic and atraumatic.  Eyes:     Conjunctiva/sclera: Conjunctivae normal.     Pupils: Pupils are equal, round, and reactive to light.  Cardiovascular:     Rate and Rhythm: Normal rate.     Heart sounds: Normal heart sounds.  Pulmonary:     Effort: Pulmonary effort is normal. No respiratory distress.     Breath sounds: Normal breath sounds.  Chest:     Chest wall: Tenderness present.  Abdominal:     General: There is no distension.     Palpations: Abdomen is soft.  Musculoskeletal:        General: Normal range of motion.     Cervical back: Normal range of motion.       Back:  Skin:    General: Skin is warm and dry.  Neurological:     Mental Status: He is alert.      UC Treatments / Results  Labs (all labs ordered are listed, but only abnormal results are  displayed) Labs Reviewed  POCT URINALYSIS DIP (MANUAL ENTRY) - Abnormal; Notable for the following components:      Result Value   Glucose, UA >=1,000 (*)    All other components within normal limits    EKG   Radiology No results found.  Procedures Procedures (including critical care time)  Medications Ordered in UC Medications - No data to display  Initial Impression / Assessment and Plan / UC Course  I have reviewed the triage vital signs and the nursing notes.  Pertinent labs & imaging results that were available during my care of the patient were reviewed by me and considered in my medical decision making (see chart for details).     She has kidney was involved.  There is no hematuria.  I did review his x-rays and this area was thoroughly evaluated.  I do not believe he has any displaced fracture.  I think it is more of a soft tissue injury.  Needs more time to heal. Final Clin I checked a UA to l Impressions(s) / UC Diagnoses   Final diagnoses:  Rib pain on right side  Rib contusion, right, sequela     Discharge Instructions      May take tylenol for pain.  Up to 1000 mg three times a day Use warmth to area Limit activity while back is painful Try the muscle relaxer methocarbamol to help with pain Take the gabapentin at night until pain improved Call for problems    ED Prescriptions     Medication Sig Dispense Auth. Provider   methocarbamol (ROBAXIN-750) 750 MG tablet Take 1 tablet (750 mg total) by mouth every 6 (six) hours as needed for muscle spasms. 20 tablet Eustace Moore, MD   gabapentin (NEURONTIN) 300 MG capsule Take 1 capsule (300 mg total) by mouth at bedtime. 10 capsule Eustace Moore, MD      PDMP not reviewed this encounter.   Eustace Moore, MD 01/27/24 [phone]

## 2024-01-27 NOTE — ED Triage Notes (Addendum)
 Back still hurts from when he fell on 3/21. Was improving some but last night worsened. No otc meds. Was taking hydrocodone but it constipated him too much so he has tried not to take any since.

## 2024-01-27 NOTE — Discharge Instructions (Signed)
 May take tylenol for pain.  Up to 1000 mg three times a day Use warmth to area Limit activity while back is painful Try the muscle relaxer methocarbamol to help with pain Take the gabapentin at night until pain improved Call for problems
